# Patient Record
Sex: Male | Born: 1995 | Race: Black or African American | Hispanic: No | Marital: Single | Smoking: Never smoker
Health system: Southern US, Community
[De-identification: ages and names within clinical notes are randomized; demographics above are authoritative.]

## PROBLEM LIST (undated history)

## (undated) HISTORY — PX: FRACTURE SURGERY: SHX138

---

## 2017-06-17 ENCOUNTER — Encounter (HOSPITAL_COMMUNITY): Payer: Self-pay | Admitting: Emergency Medicine

## 2017-06-17 ENCOUNTER — Other Ambulatory Visit: Payer: Self-pay

## 2017-06-17 ENCOUNTER — Emergency Department (HOSPITAL_COMMUNITY): Payer: Self-pay

## 2017-06-17 DIAGNOSIS — Z5321 Procedure and treatment not carried out due to patient leaving prior to being seen by health care provider: Secondary | ICD-10-CM | POA: Insufficient documentation

## 2017-06-17 DIAGNOSIS — R079 Chest pain, unspecified: Secondary | ICD-10-CM | POA: Insufficient documentation

## 2017-06-17 DIAGNOSIS — R05 Cough: Secondary | ICD-10-CM | POA: Insufficient documentation

## 2017-06-17 LAB — CBC WITH DIFFERENTIAL/PLATELET
BASOS ABS: 0 10*3/uL (ref 0.0–0.1)
Basophils Relative: 0 %
Eosinophils Absolute: 0.1 10*3/uL (ref 0.0–0.7)
Eosinophils Relative: 1 %
HEMATOCRIT: 46.4 % (ref 39.0–52.0)
Hemoglobin: 16 g/dL (ref 13.0–17.0)
LYMPHS PCT: 39 %
Lymphs Abs: 3 10*3/uL (ref 0.7–4.0)
MCH: 29.1 pg (ref 26.0–34.0)
MCHC: 34.5 g/dL (ref 30.0–36.0)
MCV: 84.5 fL (ref 78.0–100.0)
MONO ABS: 0.7 10*3/uL (ref 0.1–1.0)
MONOS PCT: 9 %
NEUTROS ABS: 3.9 10*3/uL (ref 1.7–7.7)
Neutrophils Relative %: 51 %
Platelets: 283 10*3/uL (ref 150–400)
RBC: 5.49 MIL/uL (ref 4.22–5.81)
RDW: 12.5 % (ref 11.5–15.5)
WBC: 7.6 10*3/uL (ref 4.0–10.5)

## 2017-06-17 LAB — I-STAT TROPONIN, ED: Troponin i, poc: 0 ng/mL (ref 0.00–0.08)

## 2017-06-17 LAB — COMPREHENSIVE METABOLIC PANEL
ALT: 20 U/L (ref 17–63)
AST: 24 U/L (ref 15–41)
Albumin: 4.6 g/dL (ref 3.5–5.0)
Alkaline Phosphatase: 79 U/L (ref 38–126)
Anion gap: 9 (ref 5–15)
BILIRUBIN TOTAL: 0.4 mg/dL (ref 0.3–1.2)
BUN: 10 mg/dL (ref 6–20)
CALCIUM: 9.3 mg/dL (ref 8.9–10.3)
CO2: 26 mmol/L (ref 22–32)
Chloride: 104 mmol/L (ref 101–111)
Creatinine, Ser: 1.36 mg/dL — ABNORMAL HIGH (ref 0.61–1.24)
GFR calc Af Amer: >60 mL/min (ref >60)
Glucose, Bld: 92 mg/dL (ref 65–99)
POTASSIUM: 3.7 mmol/L (ref 3.5–5.1)
Sodium: 139 mmol/L (ref 135–145)
TOTAL PROTEIN: 8 g/dL (ref 6.5–8.1)

## 2017-06-17 NOTE — ED Triage Notes (Signed)
Pt c/o right side chest pain onset earlier today.  Pt st's pain is in right chest and under right breast.  Pt also c/o productive cough

## 2017-06-18 ENCOUNTER — Emergency Department (HOSPITAL_COMMUNITY)
Admission: EM | Admit: 2017-06-18 | Discharge: 2017-06-18 | Disposition: A | Discharge status: Home / Self Care | Payer: Self-pay | Attending: Emergency Medicine | Admitting: Emergency Medicine

## 2017-06-18 NOTE — ED Notes (Signed)
Pt called multiple times, Pt is not present moved from B14 back into waiting area.

## 2017-06-18 NOTE — ED Notes (Signed)
Pt called for again, Pt moved OTF with approval from charge.

## 2018-12-25 IMAGING — DX DG CHEST 2V
2 series · 2 of 2 positions shown · non-contrast
Comparison: None.

CLINICAL DATA: Chest pain with shortness of breath

EXAM:
CHEST  2 VIEW

[chest pa]
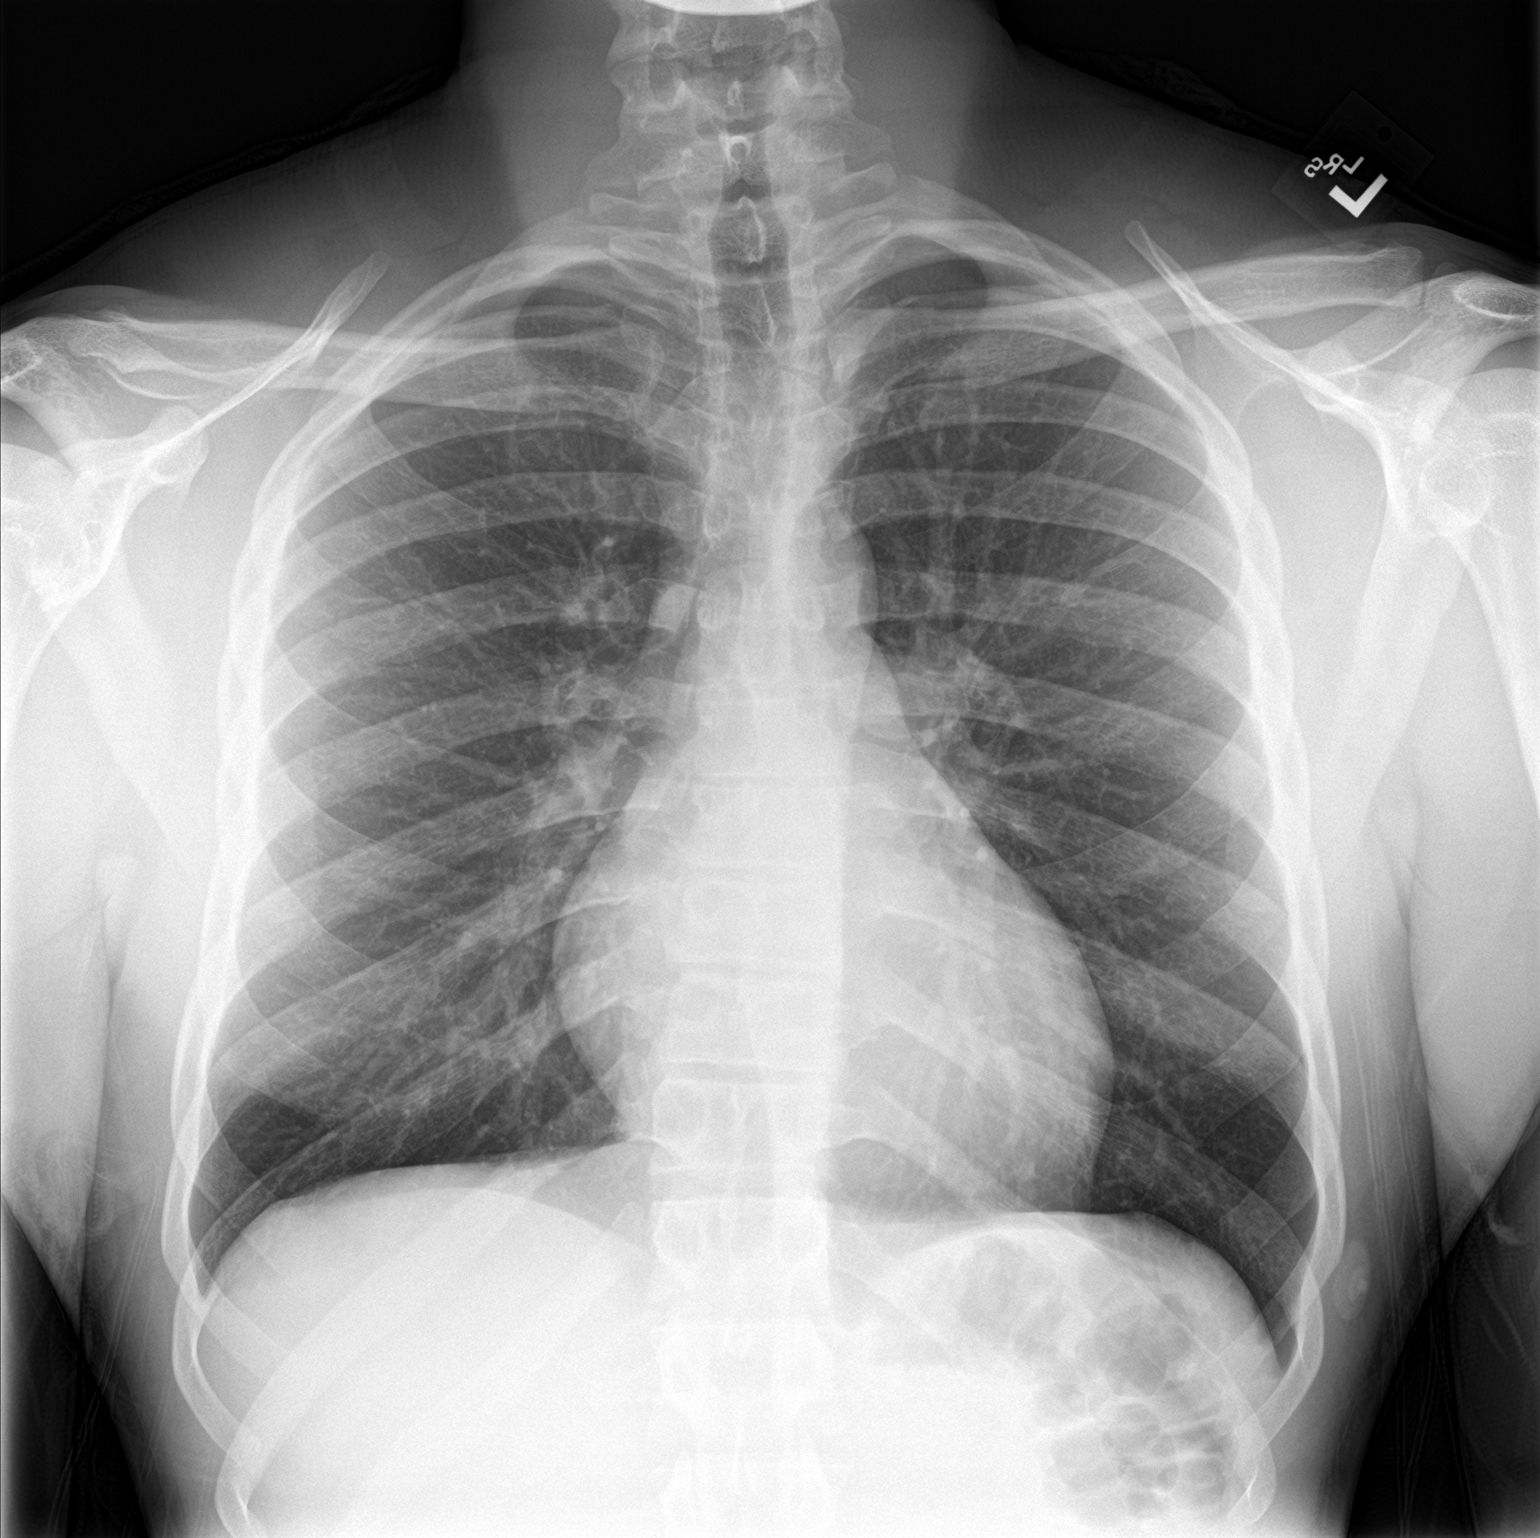

[chest lat]
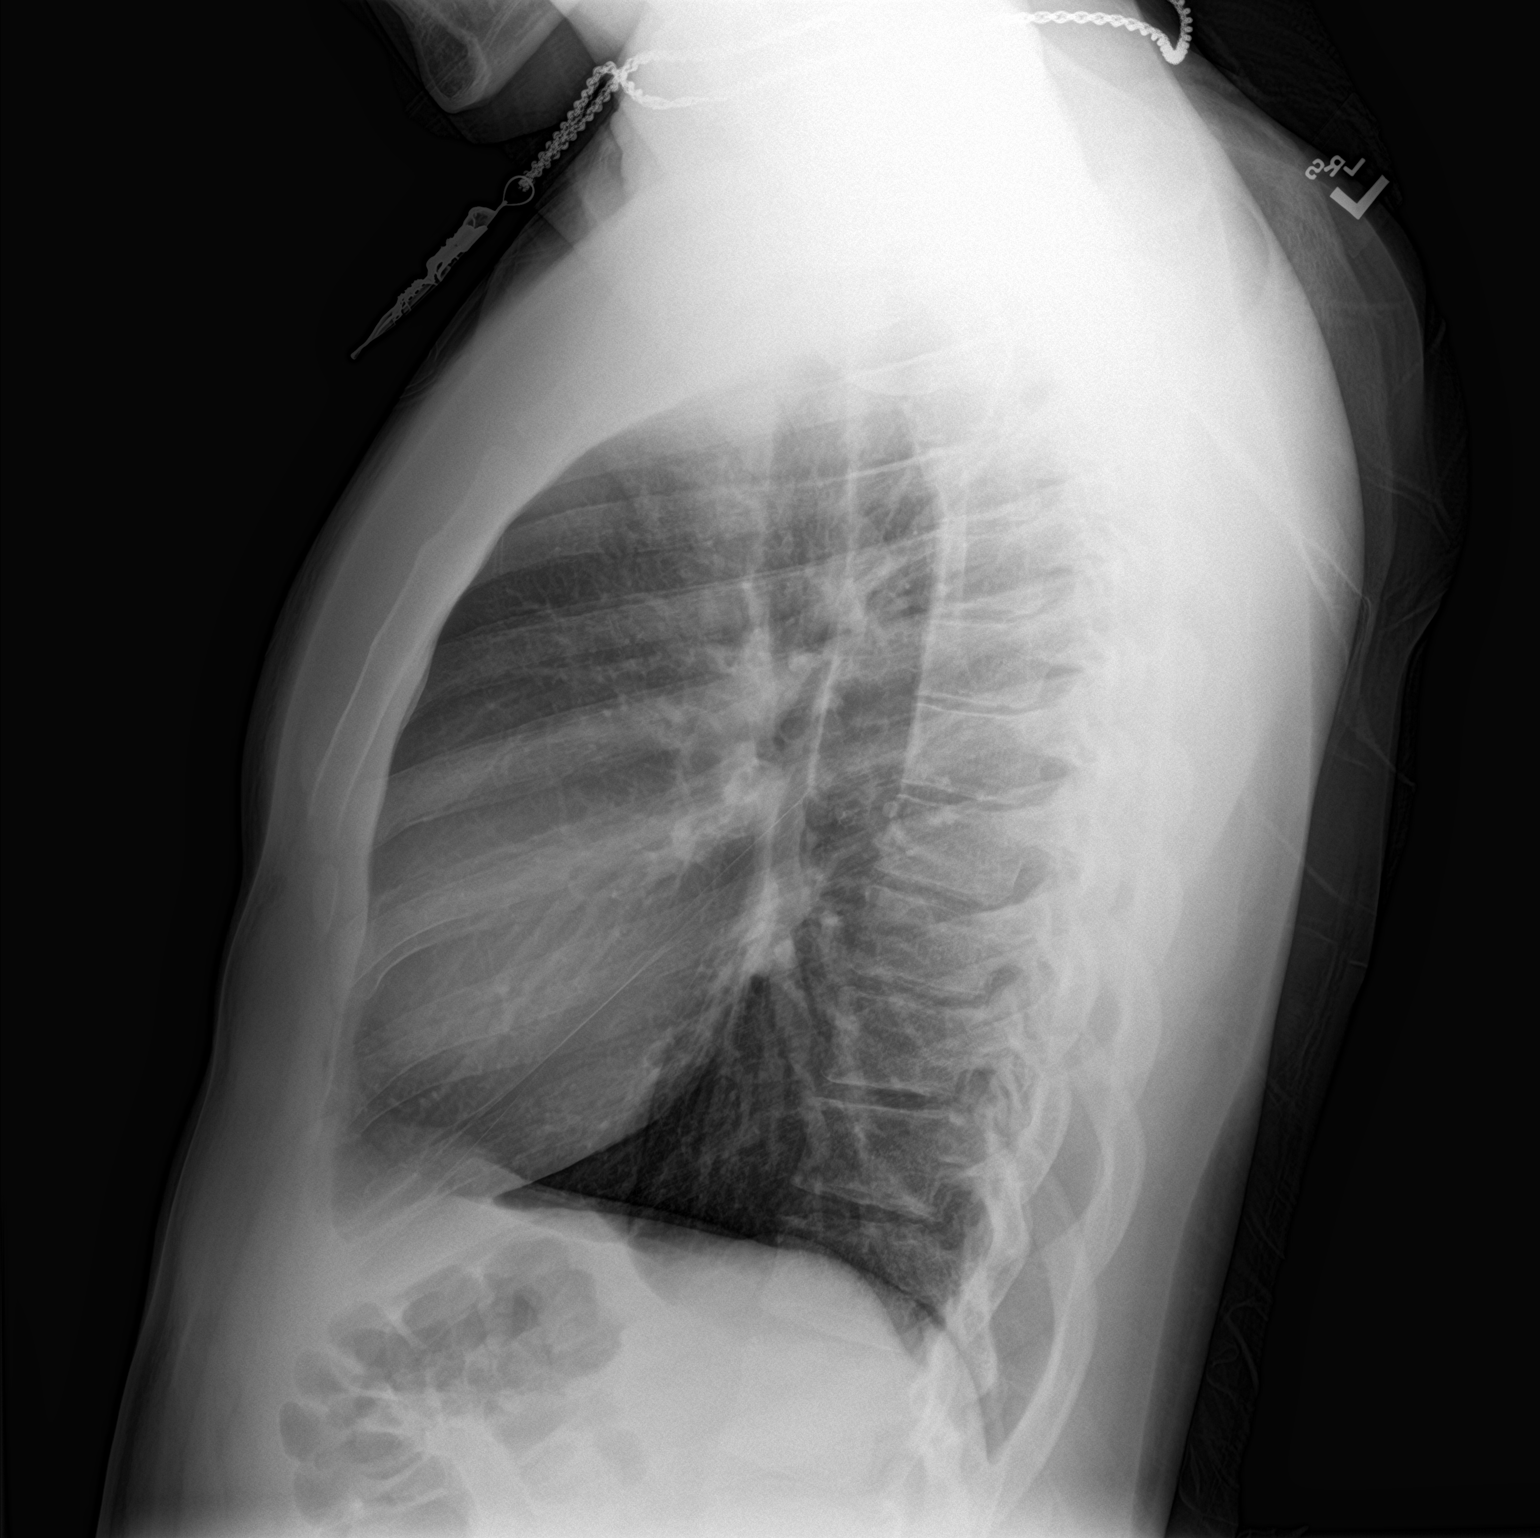

[2 of 2 positions shown; findings below may reference images not displayed]

FINDINGS: The heart size and mediastinal contours are within normal limits.
Both lungs are clear. The visualized skeletal structures are
unremarkable.
IMPRESSION: No active cardiopulmonary disease.
